# Patient Record
Sex: Female | Born: 1965
Health system: Southern US, Community
[De-identification: ages and names within clinical notes are randomized; demographics above are authoritative.]

## PROBLEM LIST (undated history)

## (undated) DIAGNOSIS — J309 Allergic rhinitis, unspecified: Secondary | ICD-10-CM

## (undated) DIAGNOSIS — Z9289 Personal history of other medical treatment: Secondary | ICD-10-CM

## (undated) DIAGNOSIS — N9419 Other specified dyspareunia: Secondary | ICD-10-CM

## (undated) DIAGNOSIS — E28319 Asymptomatic premature menopause: Secondary | ICD-10-CM

## (undated) DIAGNOSIS — F329 Major depressive disorder, single episode, unspecified: Secondary | ICD-10-CM

## (undated) DIAGNOSIS — F32A Depression, unspecified: Secondary | ICD-10-CM

## (undated) DIAGNOSIS — F419 Anxiety disorder, unspecified: Secondary | ICD-10-CM

## (undated) HISTORY — DX: Other specified dyspareunia: N94.19

## (undated) HISTORY — DX: Allergic rhinitis, unspecified: J30.9

## (undated) HISTORY — DX: Depression, unspecified: F32.A

## (undated) HISTORY — DX: Asymptomatic premature menopause: E28.319

## (undated) HISTORY — DX: Major depressive disorder, single episode, unspecified: F32.9

## (undated) HISTORY — DX: Anxiety disorder, unspecified: F41.9

## (undated) HISTORY — DX: Personal history of other medical treatment: Z92.89

---

## 2004-02-07 ENCOUNTER — Ambulatory Visit: Payer: Self-pay | Admitting: Unknown Physician Specialty

## 2005-09-24 ENCOUNTER — Ambulatory Visit: Payer: Self-pay | Admitting: Unknown Physician Specialty

## 2009-06-08 ENCOUNTER — Ambulatory Visit: Payer: Self-pay | Admitting: Unknown Physician Specialty

## 2009-09-30 ENCOUNTER — Ambulatory Visit: Payer: Self-pay | Admitting: Internal Medicine

## 2013-01-07 DIAGNOSIS — Z9289 Personal history of other medical treatment: Secondary | ICD-10-CM

## 2013-01-07 HISTORY — DX: Personal history of other medical treatment: Z92.89

## 2013-02-08 DIAGNOSIS — Z9289 Personal history of other medical treatment: Secondary | ICD-10-CM

## 2013-02-08 HISTORY — DX: Personal history of other medical treatment: Z92.89

## 2013-02-12 ENCOUNTER — Ambulatory Visit: Payer: Self-pay | Admitting: Family Medicine

## 2016-03-10 ENCOUNTER — Other Ambulatory Visit: Payer: Self-pay | Admitting: Obstetrics and Gynecology

## 2016-03-10 DIAGNOSIS — Z1231 Encounter for screening mammogram for malignant neoplasm of breast: Secondary | ICD-10-CM

## 2016-04-08 ENCOUNTER — Encounter: Payer: Self-pay | Admitting: Radiology

## 2016-04-08 ENCOUNTER — Ambulatory Visit
Admission: RE | Admit: 2016-04-08 | Discharge: 2016-04-08 | Disposition: A | Discharge status: Home / Self Care | Payer: BLUE CROSS/BLUE SHIELD | Source: Ambulatory Visit | Attending: Obstetrics and Gynecology | Admitting: Obstetrics and Gynecology

## 2016-04-08 DIAGNOSIS — Z1231 Encounter for screening mammogram for malignant neoplasm of breast: Secondary | ICD-10-CM | POA: Insufficient documentation

## 2016-04-15 ENCOUNTER — Inpatient Hospital Stay
Admission: RE | Admit: 2016-04-15 | Discharge: 2016-04-15 | Disposition: A | Discharge status: Home / Self Care | Payer: Self-pay | Source: Ambulatory Visit | Attending: *Deleted | Admitting: *Deleted

## 2016-04-15 ENCOUNTER — Other Ambulatory Visit: Payer: Self-pay | Admitting: *Deleted

## 2016-04-15 DIAGNOSIS — Z9289 Personal history of other medical treatment: Secondary | ICD-10-CM

## 2017-03-12 ENCOUNTER — Ambulatory Visit: Payer: Self-pay | Admitting: Obstetrics and Gynecology

## 2017-04-20 ENCOUNTER — Other Ambulatory Visit: Payer: Self-pay | Admitting: Internal Medicine

## 2017-04-20 DIAGNOSIS — Z1231 Encounter for screening mammogram for malignant neoplasm of breast: Secondary | ICD-10-CM

## 2017-05-12 ENCOUNTER — Ambulatory Visit
Admission: RE | Admit: 2017-05-12 | Discharge: 2017-05-12 | Disposition: A | Discharge status: Home / Self Care | Payer: BLUE CROSS/BLUE SHIELD | Source: Ambulatory Visit | Attending: Internal Medicine | Admitting: Internal Medicine

## 2017-05-12 DIAGNOSIS — Z1231 Encounter for screening mammogram for malignant neoplasm of breast: Secondary | ICD-10-CM | POA: Diagnosis present

## 2017-05-24 ENCOUNTER — Other Ambulatory Visit: Payer: Self-pay | Admitting: Obstetrics and Gynecology

## 2017-05-25 NOTE — Telephone Encounter (Signed)
Please advise 

## 2017-11-18 ENCOUNTER — Other Ambulatory Visit: Payer: Self-pay | Admitting: Obstetrics and Gynecology

## 2017-11-18 ENCOUNTER — Telehealth: Payer: Self-pay

## 2017-11-18 MED ORDER — ESTRADIOL 10 MCG VA TABS: 10.0000 ug | ORAL_TABLET | VAGINAL | 0 refills | Status: DC

## 2017-11-18 NOTE — Progress Notes (Signed)
Rx RF till annual 

## 2017-11-18 NOTE — Telephone Encounter (Signed)
Rx RF eRxd.  

## 2017-11-18 NOTE — Telephone Encounter (Signed)
Pt has scheduled appt for 9/5 and is requesting a refill on uvafem.  [phone]

## 2017-12-03 ENCOUNTER — Ambulatory Visit (INDEPENDENT_AMBULATORY_CARE_PROVIDER_SITE_OTHER): Payer: BLUE CROSS/BLUE SHIELD | Admitting: Obstetrics and Gynecology

## 2017-12-03 ENCOUNTER — Encounter: Payer: Self-pay | Admitting: Obstetrics and Gynecology

## 2017-12-03 VITALS — BP 100/60 | HR 64 | Ht 62.0 in | Wt 124.0 lb

## 2017-12-03 DIAGNOSIS — E28319 Asymptomatic premature menopause: Secondary | ICD-10-CM | POA: Insufficient documentation

## 2017-12-03 DIAGNOSIS — N952 Postmenopausal atrophic vaginitis: Secondary | ICD-10-CM

## 2017-12-03 DIAGNOSIS — Z01411 Encounter for gynecological examination (general) (routine) with abnormal findings: Secondary | ICD-10-CM

## 2017-12-03 DIAGNOSIS — Z01419 Encounter for gynecological examination (general) (routine) without abnormal findings: Secondary | ICD-10-CM | POA: Diagnosis not present

## 2017-12-03 DIAGNOSIS — Z13820 Encounter for screening for osteoporosis: Secondary | ICD-10-CM

## 2017-12-03 DIAGNOSIS — Z1239 Encounter for other screening for malignant neoplasm of breast: Secondary | ICD-10-CM

## 2017-12-03 DIAGNOSIS — Z1231 Encounter for screening mammogram for malignant neoplasm of breast: Secondary | ICD-10-CM

## 2017-12-03 MED ORDER — ESTRADIOL 10 MCG VA TABS: 10.0000 ug | ORAL_TABLET | VAGINAL | 3 refills | Status: DC

## 2017-12-03 NOTE — Progress Notes (Addendum)
PCP:  Lynnea Ferrier, MD   Chief Complaint  Patient presents with  . Gynecologic Exam     HPI:      Ms. Haley Lewis is a 52 y.o. G1P1001 who LMP was No LMP recorded. Patient is postmenopausal., presents today for her annual examination.  Her menses are absent due to early menopause (age 78). She does not have intermenstrual bleeding. Otilio Carpen has vasomotor sx.  Sex activity: single partner, contraception - post menopausal status. Pt with severe vag dryness/dyspareunia. Treats with vagifem with minimal relief.  Last Pap: March 12, 2016  Results were: no abnormalities /neg HPV DNA  Hx of STDs: none  Last mammogram: May 12, 2017  Results were: normal--routine follow-up in 12 months There is a FH of breast cancer in her mat aunt, genetic testing not indicated. There is no FH of ovarian cancer. The patient does do self-breast exams.  Tobacco use: The patient denies current or previous tobacco use. Alcohol use: social drinker No drug use.  Exercise: moderately active  She does get adequate calcium but not Vitamin D in her diet. Labs with PCP. Never had DEXA.   Past Medical History:  Diagnosis Date  . Allergic rhinitis   . Anxiety and depression   . Dyspareunia due to medical condition in female   . Early menopause age 30  . History of mammogram 02/08/2013   Birad 1  . History of Papanicolaou smear of cervix 01/07/2013   Neg/neg  . Osteopenia 08/2018   in spine/hip. Repeat in 2 yrs.     Past Surgical History:  Procedure Laterality Date  . CESAREAN SECTION  2003   VanDalen-ruptured membranes, labor, breech    Family History  Problem Relation Age of Onset  . Breast cancer Maternal Aunt 52  . Osteoporosis Maternal Aunt   . Osteoarthritis Mother   . Heart disease Father   . Ovarian cancer Neg Hx     Social History   Socioeconomic History  . Marital status: Married    Spouse name: Not on file  . Number of children: Not on file  . Years of  education: Not on file  . Highest education level: Not on file  Occupational History  . Not on file  Social Needs  . Financial resource strain: Not on file  . Food insecurity    Worry: Not on file    Inability: Not on file  . Transportation needs    Medical: Not on file    Non-medical: Not on file  Tobacco Use  . Smoking status: Never Smoker  . Smokeless tobacco: Never Used  Substance and Sexual Activity  . Alcohol use: Never    Frequency: Never  . Drug use: Never  . Sexual activity: Yes    Birth control/protection: Post-menopausal  Lifestyle  . Physical activity    Days per week: Not on file    Minutes per session: Not on file  . Stress: Not on file  Relationships  . Social Musician on phone: Not on file    Gets together: Not on file    Attends religious service: Not on file    Active member of club or organization: Not on file    Attends meetings of clubs or organizations: Not on file    Relationship status: Not on file  . Intimate partner violence    Fear of current or ex partner: Not on file    Emotionally abused: Not on file  Physically abused: Not on file    Forced sexual activity: Not on file  Other Topics Concern  . Not on file  Social History Narrative  . Not on file    Outpatient Medications Prior to Visit  Medication Sig Dispense Refill  . azelastine (ASTELIN) 0.1 % nasal spray Place into the nose.    . Estradiol (YUVAFEM) 10 MCG TABS vaginal tablet Place 1 tablet (10 mcg total) vaginally 2 (two) times a week. 8 tablet 0   No facility-administered medications prior to visit.       ROS:  Review of Systems  Constitutional: Negative for fatigue, fever and unexpected weight change.  Respiratory: Negative for cough, shortness of breath and wheezing.   Cardiovascular: Negative for chest pain, palpitations and leg swelling.  Gastrointestinal: Negative for blood in stool, constipation, diarrhea, nausea and vomiting.  Endocrine: Negative  for cold intolerance, heat intolerance and polyuria.  Genitourinary: Positive for dyspareunia. Negative for dysuria, flank pain, frequency, genital sores, hematuria, menstrual problem, pelvic pain, urgency, vaginal bleeding, vaginal discharge and vaginal pain.  Musculoskeletal: Negative for back pain, joint swelling and myalgias.  Skin: Negative for rash.  Neurological: Negative for dizziness, syncope, light-headedness, numbness and headaches.  Hematological: Negative for adenopathy.  Psychiatric/Behavioral: Negative for agitation, confusion, sleep disturbance and suicidal ideas. The patient is not nervous/anxious.   BREAST: No symptoms   Objective: BP 100/60   Pulse 64   Ht 5\' 2"  (1.575 m)   Wt 124 lb (56.2 kg)   BMI 22.68 kg/m    Physical Exam  Constitutional: She is oriented to person, place, and time. She appears well-developed and well-nourished.  Genitourinary: Vagina normal and uterus normal. There is no rash or tenderness on the right labia. There is no rash or tenderness on the left labia. No erythema or tenderness in the vagina. No vaginal discharge found. Right adnexum does not display mass and does not display tenderness. Left adnexum does not display mass and does not display tenderness. Cervix does not exhibit motion tenderness or polyp. Uterus is not enlarged or tender.  Genitourinary Comments: SMALL INTROITUS; VAG TISSUE WITH MINIMAL ATROPHY; EXAM IS UNCOMFORTABLE FOR PT  Neck: Normal range of motion. No thyromegaly present.  Cardiovascular: Normal rate, regular rhythm and normal heart sounds.  No murmur heard. Pulmonary/Chest: Effort normal and breath sounds normal. Right breast exhibits no mass, no nipple discharge, no skin change and no tenderness. Left breast exhibits no mass, no nipple discharge, no skin change and no tenderness.  Abdominal: Soft. There is no tenderness. There is no guarding.  Musculoskeletal: Normal range of motion.  Neurological: She is alert and  oriented to person, place, and time. No cranial nerve deficit.  Psychiatric: She has a normal mood and affect. Her behavior is normal.  Vitals reviewed.   Assessment/Plan: Encounter for annual routine gynecological examination  Screening for breast cancer - Pt current on mammo. - Plan: MM 3D SCREEN BREAST BILATERAL, CANCELED: MM DIGITAL SCREENING BILATERAL  Postmenopausal atrophic vaginitis - Rx RF vagifem 2x weekly. Try adding DHEA 5 mg daily to see if helps wiht vag sx.  - Plan: Estradiol (YUVAFEM) 10 MCG TABS vaginal tablet  Early menopause occurring in patient age younger than 45 years - CHeck DEXA with mammo 2/20. Cont ca/add Vit D3.  - Plan: DG Bone Density  Screening for osteoporosis - Plan: DG Bone Density  Meds ordered this encounter  Medications  . Estradiol (YUVAFEM) 10 MCG TABS vaginal tablet    Sig: Place 1  tablet (10 mcg total) vaginally 2 (two) times a week.    Dispense:  25 tablet    Refill:  3    Order Specific Question:   Supervising Provider    Answer:   Nadara Mustard [[id number]]             GYN counsel breast self exam, mammography screening, menopause, osteoporosis, adequate intake of calcium and vitamin D, diet and exercise     F/U  Return in about 1 year (around 12/04/2018).  Haley Lewis B. Tacie Mccuistion, PA-C 12/09/2018 11:51 AM

## 2017-12-03 NOTE — Patient Instructions (Signed)
I value your feedback and entrusting us with your care. If you get a [facility] patient survey, I would appreciate you taking the time to let us know about your experience today. Thank you! 

## 2017-12-07 ENCOUNTER — Telehealth: Payer: Self-pay | Admitting: Obstetrics and Gynecology

## 2017-12-07 NOTE — Telephone Encounter (Signed)
Patient is aware of mammogram and DEXA on Thursday, 05/13/18 @ 10:40am at [facility]. Patient is aware to avoid perfume, lotion, and deoderant for the mammogram, and to avoid wearing metal on her clothing for the DEXA. Directions given.

## 2017-12-07 NOTE — Addendum Note (Signed)
Addended by: Althea Grimmer B on: 12/07/2017 08:31 AM   Modules accepted: Orders

## 2017-12-07 NOTE — Telephone Encounter (Signed)
-----   Message from Rica Records, PA-C sent at 12/03/2017 10:01 AM EDT ----- Pt wants DEXA with mammo in 05/2018. Not sure how to schedule that now vs wait till then.

## 2018-03-15 ENCOUNTER — Telehealth: Payer: Self-pay

## 2018-03-15 NOTE — Telephone Encounter (Signed)
Huntley DecSara can you please call pt and schedule appt please. Thank you.

## 2018-03-15 NOTE — Telephone Encounter (Signed)
Please advise 

## 2018-03-15 NOTE — Telephone Encounter (Signed)
Patient is schedule 03/16/18 with [facility]

## 2018-03-15 NOTE — Telephone Encounter (Signed)
Pt needs HRT appt. Not as easy as a "patch".

## 2018-03-15 NOTE — Telephone Encounter (Signed)
Pt would like rx for the patch for night sweats and irritability that has returned.  The patch seems to work well for her peers.  [phone]

## 2018-03-16 ENCOUNTER — Ambulatory Visit (INDEPENDENT_AMBULATORY_CARE_PROVIDER_SITE_OTHER): Payer: BLUE CROSS/BLUE SHIELD | Admitting: Obstetrics and Gynecology

## 2018-03-16 ENCOUNTER — Encounter: Payer: Self-pay | Admitting: Obstetrics and Gynecology

## 2018-03-16 VITALS — BP 100/70 | HR 74 | Ht 62.0 in | Wt 123.0 lb

## 2018-03-16 DIAGNOSIS — R454 Irritability and anger: Secondary | ICD-10-CM | POA: Diagnosis not present

## 2018-03-16 DIAGNOSIS — N951 Menopausal and female climacteric states: Secondary | ICD-10-CM | POA: Diagnosis not present

## 2018-03-16 MED ORDER — PROGESTERONE MICRONIZED 100 MG PO CAPS: ORAL_CAPSULE | ORAL | 1 refills | Status: DC

## 2018-03-16 NOTE — Progress Notes (Signed)
System, Provider Not In   Chief Complaint  Patient presents with  . Hormone Replacement Therapy    pt has started hot flashes again about 4 weeks ago    HPI:      Haley Lewis is a 52 y.o. G1P1001 who LMP was No LMP recorded. Patient is postmenopausal., presents today for vasomotor sx (hot flashes and night sweats) for the past 4 wks. Pt also with mood changes/irritability. Went through early menopause age 52. Hasn't been on HRT in past except vag ERT for vag dryness. Has been under increased stress recently which may contribute to vasomotor sx. Had normal TSH with PCP summer 2019. Pt would like to try medication for mood. Has done antidepressants in distant past for irritability/anger with sx improvement, but would rather try something else for now.  Last annual 9/19.    Past Medical History:  Diagnosis Date  . Allergic rhinitis   . Anxiety and depression   . Dyspareunia due to medical condition in female   . Early menopause age 52  . History of mammogram 02/08/2013   Birad 1  . History of Papanicolaou smear of cervix 01/07/2013   Neg/neg    Past Surgical History:  Procedure Laterality Date  . CESAREAN SECTION  2003   VanDalen-ruptured membranes, labor, breech    Family History  Problem Relation Age of Onset  . Breast cancer Maternal Aunt 52  . Osteoporosis Maternal Aunt   . Osteoarthritis Mother   . Heart disease Father   . Ovarian cancer Neg Hx     Social History   Socioeconomic History  . Marital status: Married    Spouse name: Not on file  . Number of children: Not on file  . Years of education: Not on file  . Highest education level: Not on file  Occupational History  . Not on file  Social Needs  . Financial resource strain: Not on file  . Food insecurity:    Worry: Not on file    Inability: Not on file  . Transportation needs:    Medical: Not on file    Non-medical: Not on file  Tobacco Use  . Smoking status: Never Smoker  . Smokeless  tobacco: Never Used  Substance and Sexual Activity  . Alcohol use: Never    Frequency: Never  . Drug use: Never  . Sexual activity: Yes    Birth control/protection: Post-menopausal  Lifestyle  . Physical activity:    Days per week: Not on file    Minutes per session: Not on file  . Stress: Not on file  Relationships  . Social connections:    Talks on phone: Not on file    Gets together: Not on file    Attends religious service: Not on file    Active member of club or organization: Not on file    Attends meetings of clubs or organizations: Not on file    Relationship status: Not on file  . Intimate partner violence:    Fear of current or ex partner: Not on file    Emotionally abused: Not on file    Physically abused: Not on file    Forced sexual activity: Not on file  Other Topics Concern  . Not on file  Social History Narrative  . Not on file    Outpatient Medications Prior to Visit  Medication Sig Dispense Refill  . azelastine (ASTELIN) 0.1 % nasal spray Place into the nose.    .Marland Kitchen  Estradiol (YUVAFEM) 10 MCG TABS vaginal tablet Place 1 tablet (10 mcg total) vaginally 2 (two) times a week. (Patient not taking: Reported on 03/16/2018) 25 tablet 3   No facility-administered medications prior to visit.     ROS:  Review of Systems  Constitutional: Negative for fever.  Gastrointestinal: Negative for blood in stool, constipation, diarrhea, nausea and vomiting.  Genitourinary: Negative for dyspareunia, dysuria, flank pain, frequency, hematuria, urgency, vaginal bleeding, vaginal discharge and vaginal pain.  Musculoskeletal: Negative for back pain.  Skin: Negative for rash.   BREAST: No symptoms   OBJECTIVE:   Vitals:  BP 100/70   Pulse 74   Ht 5\' 2"  (1.575 m)   Wt 123 lb (55.8 kg)   BMI 22.50 kg/m   Physical Exam Vitals signs reviewed.  Constitutional:      Appearance: She is well-developed.  Neck:     Musculoskeletal: Normal range of motion.  Pulmonary:      Effort: Pulmonary effort is normal.  Musculoskeletal: Normal range of motion.  Neurological:     Mental Status: She is alert and oriented to person, place, and time.     Cranial Nerves: No cranial nerve deficit.  Psychiatric:        Behavior: Behavior normal.        Thought Content: Thought content normal.        Judgment: Judgment normal.      Assessment/Plan: Vasomotor symptoms due to menopause - Plan: progesterone (PROMETRIUM) 100 MG capsule  Irritability - May be better treated with SSRI but will try HRT first.  Discussed tx options. Given length of time since LMP, try prog first. May need to add ERT. May be related to stress given acute onset. Rx prometrium. F/u via phone in 7-8 wks re: sx and med mgmt/sooner prn.   Meds ordered this encounter  Medications  . progesterone (PROMETRIUM) 100 MG capsule    Sig: Take 1 cap nightly, 6 nights on, 1 night off    Dispense:  30 capsule    Refill:  1    Order Specific Question:   Supervising Provider    Answer:   Nadara Mustard [[id number]]      Return if symptoms worsen or fail to improve.  Alicia B. Copland, PA-C 03/16/2018 3:56 PM

## 2018-03-16 NOTE — Patient Instructions (Signed)
I value your feedback and entrusting us with your care. If you get a [facility] patient survey, I would appreciate you taking the time to let us know about your experience today. Thank you! 

## 2018-05-13 ENCOUNTER — Other Ambulatory Visit: Payer: BLUE CROSS/BLUE SHIELD

## 2018-06-16 ENCOUNTER — Other Ambulatory Visit: Payer: Self-pay | Admitting: Obstetrics and Gynecology

## 2018-06-16 DIAGNOSIS — N951 Menopausal and female climacteric states: Secondary | ICD-10-CM

## 2018-06-16 NOTE — Telephone Encounter (Signed)
Please advise 

## 2018-06-17 ENCOUNTER — Encounter: Payer: Self-pay | Admitting: Obstetrics and Gynecology

## 2018-06-18 ENCOUNTER — Other Ambulatory Visit: Payer: Self-pay | Admitting: Obstetrics and Gynecology

## 2018-06-18 DIAGNOSIS — N951 Menopausal and female climacteric states: Secondary | ICD-10-CM

## 2018-06-18 MED ORDER — PROGESTERONE MICRONIZED 100 MG PO CAPS: ORAL_CAPSULE | ORAL | 1 refills | Status: DC

## 2018-06-18 NOTE — Progress Notes (Signed)
Rx RF progesterone for menopausal sx . Good sx control.

## 2018-06-22 ENCOUNTER — Other Ambulatory Visit: Payer: BLUE CROSS/BLUE SHIELD

## 2018-07-29 ENCOUNTER — Other Ambulatory Visit: Payer: BLUE CROSS/BLUE SHIELD

## 2018-08-30 DIAGNOSIS — M8580 Other specified disorders of bone density and structure, unspecified site: Secondary | ICD-10-CM

## 2018-08-30 HISTORY — DX: Other specified disorders of bone density and structure, unspecified site: M85.80

## 2018-09-17 ENCOUNTER — Ambulatory Visit
Admission: RE | Admit: 2018-09-17 | Discharge: 2018-09-17 | Disposition: A | Discharge status: Home / Self Care | Payer: BLUE CROSS/BLUE SHIELD | Source: Ambulatory Visit | Attending: Obstetrics and Gynecology | Admitting: Obstetrics and Gynecology

## 2018-09-17 ENCOUNTER — Encounter: Payer: Self-pay | Admitting: Obstetrics and Gynecology

## 2018-09-17 ENCOUNTER — Other Ambulatory Visit: Payer: Self-pay

## 2018-09-17 DIAGNOSIS — M85831 Other specified disorders of bone density and structure, right forearm: Secondary | ICD-10-CM | POA: Diagnosis not present

## 2018-09-17 DIAGNOSIS — Z13820 Encounter for screening for osteoporosis: Secondary | ICD-10-CM | POA: Insufficient documentation

## 2018-09-17 DIAGNOSIS — Z1231 Encounter for screening mammogram for malignant neoplasm of breast: Secondary | ICD-10-CM | POA: Diagnosis not present

## 2018-09-17 DIAGNOSIS — E28319 Asymptomatic premature menopause: Secondary | ICD-10-CM

## 2018-09-17 DIAGNOSIS — Z1239 Encounter for other screening for malignant neoplasm of breast: Secondary | ICD-10-CM

## 2018-09-17 DIAGNOSIS — M85851 Other specified disorders of bone density and structure, right thigh: Secondary | ICD-10-CM | POA: Insufficient documentation

## 2018-09-20 ENCOUNTER — Encounter: Payer: Self-pay | Admitting: Obstetrics and Gynecology

## 2018-11-24 ENCOUNTER — Encounter: Payer: Self-pay | Admitting: Obstetrics and Gynecology

## 2018-11-24 NOTE — Telephone Encounter (Signed)
Whom can I contact in billing to discuss this with? Thx

## 2018-12-17 ENCOUNTER — Encounter: Payer: Self-pay | Admitting: Obstetrics and Gynecology

## 2018-12-17 ENCOUNTER — Ambulatory Visit: Payer: BLUE CROSS/BLUE SHIELD | Admitting: Obstetrics and Gynecology

## 2018-12-19 ENCOUNTER — Encounter: Payer: Self-pay | Admitting: Obstetrics and Gynecology

## 2019-01-10 ENCOUNTER — Encounter: Payer: Self-pay | Admitting: Obstetrics and Gynecology

## 2019-01-11 ENCOUNTER — Ambulatory Visit (INDEPENDENT_AMBULATORY_CARE_PROVIDER_SITE_OTHER): Payer: BLUE CROSS/BLUE SHIELD | Admitting: Obstetrics and Gynecology

## 2019-01-11 ENCOUNTER — Other Ambulatory Visit: Payer: Self-pay

## 2019-01-11 ENCOUNTER — Encounter: Payer: Self-pay | Admitting: Obstetrics and Gynecology

## 2019-01-11 VITALS — BP 110/70 | Ht 62.0 in | Wt 125.0 lb

## 2019-01-11 DIAGNOSIS — Z01419 Encounter for gynecological examination (general) (routine) without abnormal findings: Secondary | ICD-10-CM | POA: Diagnosis not present

## 2019-01-11 DIAGNOSIS — N951 Menopausal and female climacteric states: Secondary | ICD-10-CM

## 2019-01-11 DIAGNOSIS — Z1211 Encounter for screening for malignant neoplasm of colon: Secondary | ICD-10-CM

## 2019-01-11 DIAGNOSIS — Z1231 Encounter for screening mammogram for malignant neoplasm of breast: Secondary | ICD-10-CM

## 2019-01-11 DIAGNOSIS — N952 Postmenopausal atrophic vaginitis: Secondary | ICD-10-CM

## 2019-01-11 MED ORDER — PROGESTERONE MICRONIZED 100 MG PO CAPS: ORAL_CAPSULE | ORAL | 3 refills | Status: AC

## 2019-01-11 NOTE — Patient Instructions (Signed)
I value your feedback and entrusting us with your care. If you get a [facility] patient survey, I would appreciate you taking the time to let us know about your experience today. Thank you! 

## 2019-01-11 NOTE — Progress Notes (Signed)
PCP:  Adin Hector, MD   Chief Complaint  Patient presents with   Gynecologic Exam     HPI:      Ms. Haley Lewis is a 53 y.o. G1P1001 who LMP was No LMP recorded. Patient is postmenopausal., presents today for her annual examination.  Her menses are absent due to early menopause (age 30). She does not have intermenstrual bleeding. Was having bad vasomotor sx with mood changes 12/19; started prometrium with improved sx. Life stressors also improved so pt not sure if progesterone or not. Wants to continue for now.   Sex activity: single partner, contraception - post menopausal status. Pt with severe vag dryness/dyspareunia. Treated with vagifem with minimal relief in past, not using any vag ERT currently. Not sure if she wants to cont.  Last Pap: March 12, 2016  Results were: no abnormalities /neg HPV DNA  Hx of STDs: none  Last mammogram: 09/17/18  Results were: normal--routine follow-up in 12 months There is a FH of breast cancer in her mat aunt, genetic testing not indicated. There is no FH of ovarian cancer. The patient does do self-breast exams.  Tobacco use: The patient denies current or previous tobacco use. Alcohol use: social drinker No drug use.  Exercise: moderately active  She does get adequate calcium and Vitamin D in her diet. Labs with PCP. DEXA: 6/20 with osteopenia in spine/hip  Colonoscopy: due this yr, wants GI ref  Past Medical History:  Diagnosis Date   Allergic rhinitis    Anxiety and depression    Dyspareunia due to medical condition in female    Early menopause age 61   History of mammogram 02/08/2013   Birad 1   History of Papanicolaou smear of cervix 01/07/2013   Neg/neg   Osteopenia 08/2018   in spine/hip. Repeat in 2 yrs.     Past Surgical History:  Procedure Laterality Date   CESAREAN SECTION  2003   VanDalen-ruptured membranes, labor, breech    Family History  Problem Relation Age of Onset   Breast cancer  Maternal Aunt 39   Osteoporosis Maternal Aunt    Osteoarthritis Mother    Heart disease Father    Ovarian cancer Neg Hx     Social History   Socioeconomic History   Marital status: Married    Spouse name: Not on file   Number of children: Not on file   Years of education: Not on file   Highest education level: Not on file  Occupational History   Not on file  Social Needs   Financial resource strain: Not on file   Food insecurity    Worry: Not on file    Inability: Not on file   Transportation needs    Medical: Not on file    Non-medical: Not on file  Tobacco Use   Smoking status: Never Smoker   Smokeless tobacco: Never Used  Substance and Sexual Activity   Alcohol use: Never    Frequency: Never   Drug use: Never   Sexual activity: Yes    Birth control/protection: Post-menopausal  Lifestyle   Physical activity    Days per week: Not on file    Minutes per session: Not on file   Stress: Not on file  Relationships   Social connections    Talks on phone: Not on file    Gets together: Not on file    Attends religious service: Not on file    Active member of club or  organization: Not on file    Attends meetings of clubs or organizations: Not on file    Relationship status: Not on file   Intimate partner violence    Fear of current or ex partner: Not on file    Emotionally abused: Not on file    Physically abused: Not on file    Forced sexual activity: Not on file  Other Topics Concern   Not on file  Social History Narrative   Not on file    Outpatient Medications Prior to Visit  Medication Sig Dispense Refill   azelastine (ASTELIN) 0.1 % nasal spray Place into the nose.     progesterone (PROMETRIUM) 100 MG capsule Take 1 cap nightly, 6 nights on, 1 night off 90 capsule 1   Estradiol (YUVAFEM) 10 MCG TABS vaginal tablet Place 1 tablet (10 mcg total) vaginally 2 (two) times a week. (Patient not taking: Reported on 03/16/2018) 25 tablet 3     No facility-administered medications prior to visit.       ROS:  Review of Systems  Constitutional: Negative for fatigue, fever and unexpected weight change.  Respiratory: Negative for cough, shortness of breath and wheezing.   Cardiovascular: Negative for chest pain, palpitations and leg swelling.  Gastrointestinal: Negative for blood in stool, constipation, diarrhea, nausea and vomiting.  Endocrine: Negative for cold intolerance, heat intolerance and polyuria.  Genitourinary: Positive for dyspareunia. Negative for dysuria, flank pain, frequency, genital sores, hematuria, menstrual problem, pelvic pain, urgency, vaginal bleeding, vaginal discharge and vaginal pain.  Musculoskeletal: Negative for back pain, joint swelling and myalgias.  Skin: Negative for rash.  Neurological: Negative for dizziness, syncope, light-headedness, numbness and headaches.  Hematological: Negative for adenopathy.  Psychiatric/Behavioral: Negative for agitation, confusion, sleep disturbance and suicidal ideas. The patient is not nervous/anxious.   BREAST: No symptoms   Objective: BP 110/70    Ht 5\' 2"  (1.575 m)    Wt 125 lb (56.7 kg)    BMI 22.86 kg/m    Physical Exam Constitutional:      Appearance: She is well-developed.  Genitourinary:     Vulva, vagina, uterus, right adnexa and left adnexa normal.     No vulval lesion or tenderness noted.     No vaginal discharge, erythema or tenderness.     No cervical motion tenderness or polyp.     Uterus is not enlarged or tender.     No right or left adnexal mass present.     Right adnexa not tender.     Left adnexa not tender.     Genitourinary Comments: SMALL INTROITUS; VAG TISSUE WITH MINIMAL TO MOD ATROPHY; EXAM IS UNCOMFORTABLE FOR PT  Neck:     Musculoskeletal: Normal range of motion.     Thyroid: No thyromegaly.  Cardiovascular:     Rate and Rhythm: Normal rate and regular rhythm.     Heart sounds: Normal heart sounds. No murmur.  Pulmonary:      Effort: Pulmonary effort is normal.     Breath sounds: Normal breath sounds.  Chest:     Breasts:        Right: No mass, nipple discharge, skin change or tenderness.        Left: No mass, nipple discharge, skin change or tenderness.  Abdominal:     Palpations: Abdomen is soft.     Tenderness: There is no abdominal tenderness. There is no guarding.  Musculoskeletal: Normal range of motion.  Neurological:     General: No focal deficit present.  Mental Status: She is alert and oriented to person, place, and time.     Cranial Nerves: No cranial nerve deficit.  Skin:    General: Skin is warm and dry.  Psychiatric:        Mood and Affect: Mood normal.        Behavior: Behavior normal.        Thought Content: Thought content normal.        Judgment: Judgment normal.  Vitals signs reviewed.     Assessment/Plan: Encounter for annual routine gynecological examination  Encounter for screening mammogram for malignant neoplasm of breast--pt current on mammo  Postmenopausal atrophic vaginitis--pt to f/u if wants vag ERT. F/u prn.  Vasomotor symptoms due to menopause - Plan: progesterone (PROMETRIUM) 100 MG capsule; Rx RF. Improved sx. F/u prn.   Screening for colon cancer - Plan: Ambulatory referral to Gastroenterology; Refer for scr colonoscopy due to age  Meds ordered this encounter  Medications   progesterone (PROMETRIUM) 100 MG capsule    Sig: Take 1 cap nightly, 6 nights on, 1 night off    Dispense:  90 capsule    Refill:  3    Order Specific Question:   Supervising Provider    Answer:   Nadara MustardHARRIS, ROBERT P [[id number]]             GYN counsel breast self exam, mammography screening, menopause, osteoporosis, adequate intake of calcium and vitamin D, diet and exercise     F/U  Return in about 1 year (around 01/11/2020).  Alizzon Dioguardi B. Marlyn Tondreau, PA-C 01/11/2019 3:52 PM

## 2019-01-12 ENCOUNTER — Encounter: Payer: Self-pay | Admitting: Obstetrics and Gynecology

## 2019-02-15 ENCOUNTER — Encounter: Payer: Self-pay | Admitting: Obstetrics and Gynecology

## 2019-02-18 ENCOUNTER — Telehealth: Payer: Self-pay | Admitting: Obstetrics and Gynecology

## 2019-02-18 NOTE — Telephone Encounter (Signed)
Left message for patient to call me back in regards to her appt for her colonoscopy.

## 2019-11-25 IMAGING — MG DIGITAL SCREENING BILATERAL MAMMOGRAM WITH TOMO AND CAD
8 series · 9 of 24 positions shown · non-contrast
Comparison: Previous exam(s).

CLINICAL DATA: Screening.

EXAM:
DIGITAL SCREENING BILATERAL MAMMOGRAM WITH TOMO AND CAD

[L MLO synth-2D]
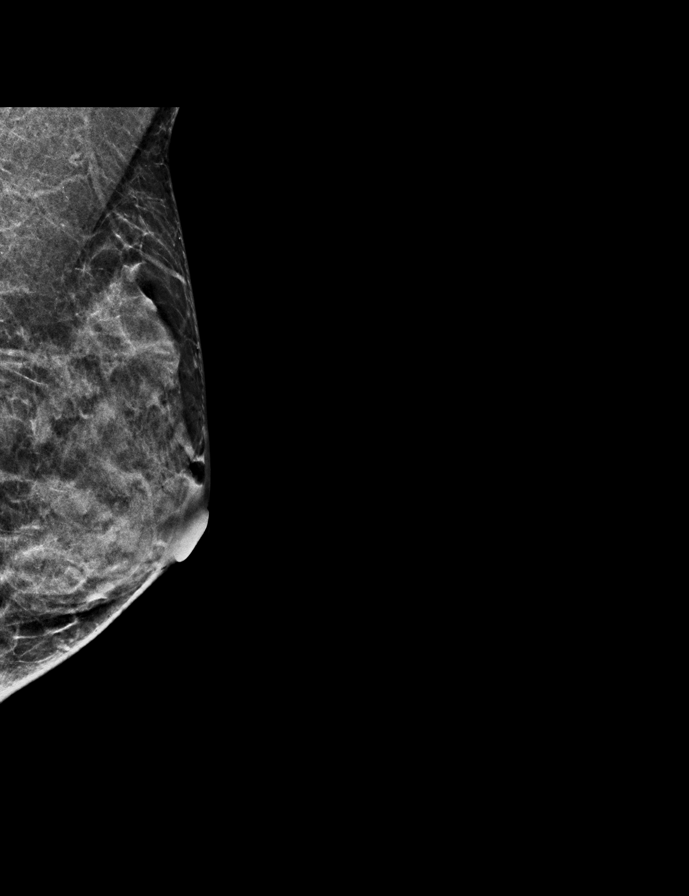

[R CC synth-2D]
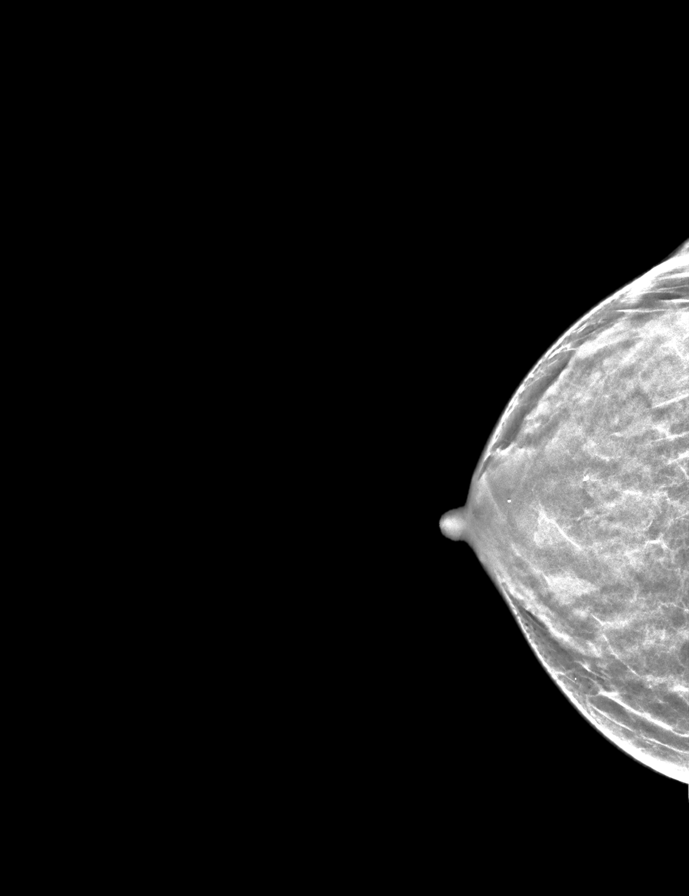

[R MLO synth-2D]
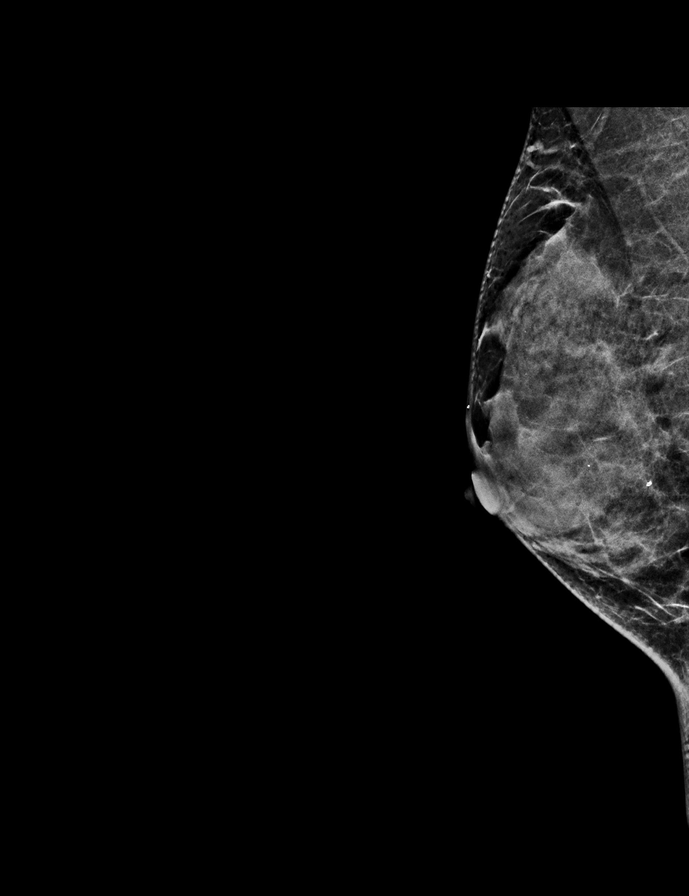

[L CC synth-2D]
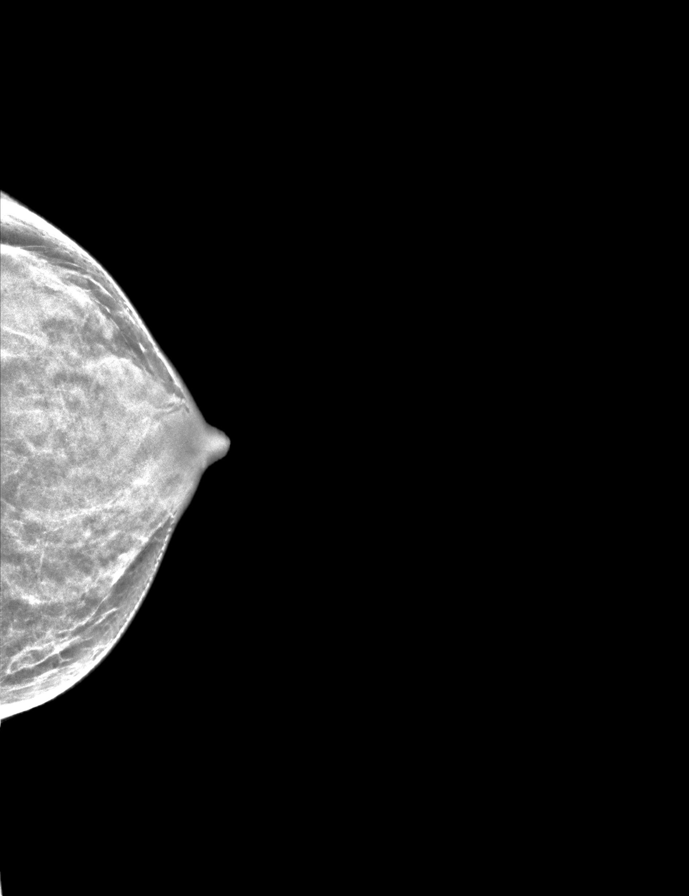

[L CC tomo · 2 of 44 frames shown]
[frame 15/44]
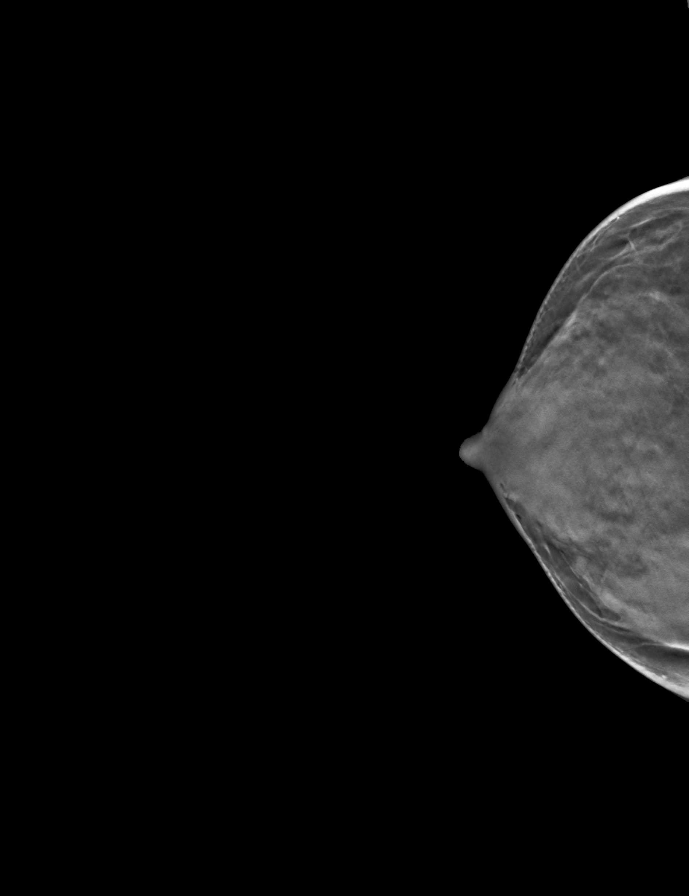
[frame 23/44]
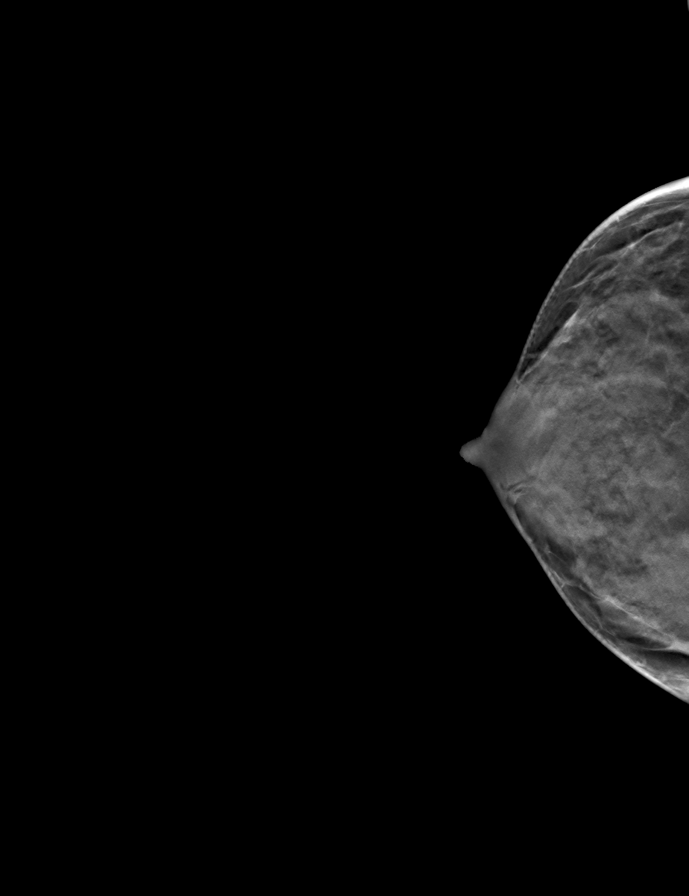

[R CC tomo · tomo slice 23/46.0]
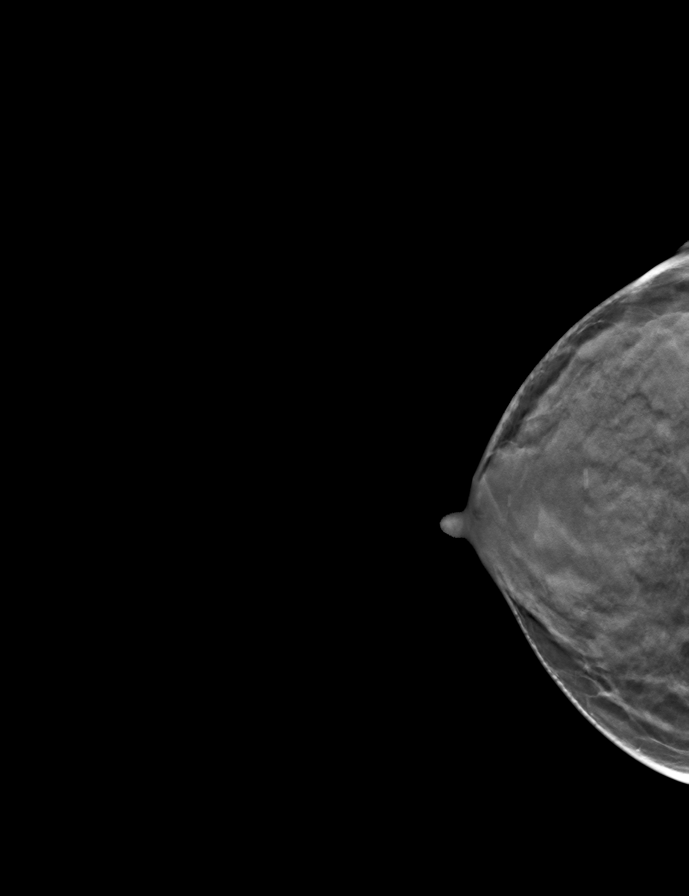

[R MLO tomo · tomo slice 23/46.0]
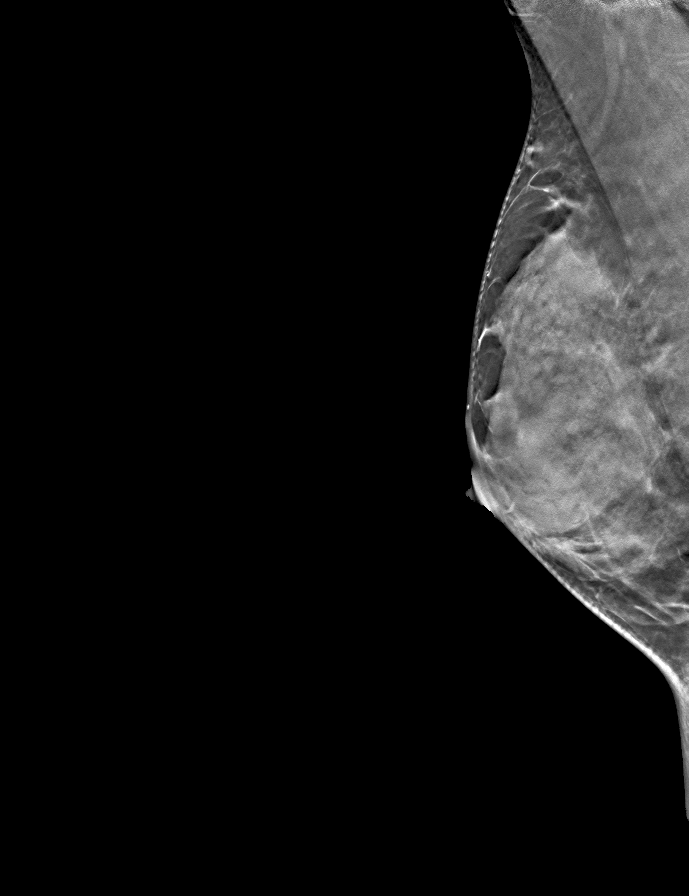

[L MLO tomo · tomo slice 21/42.0]
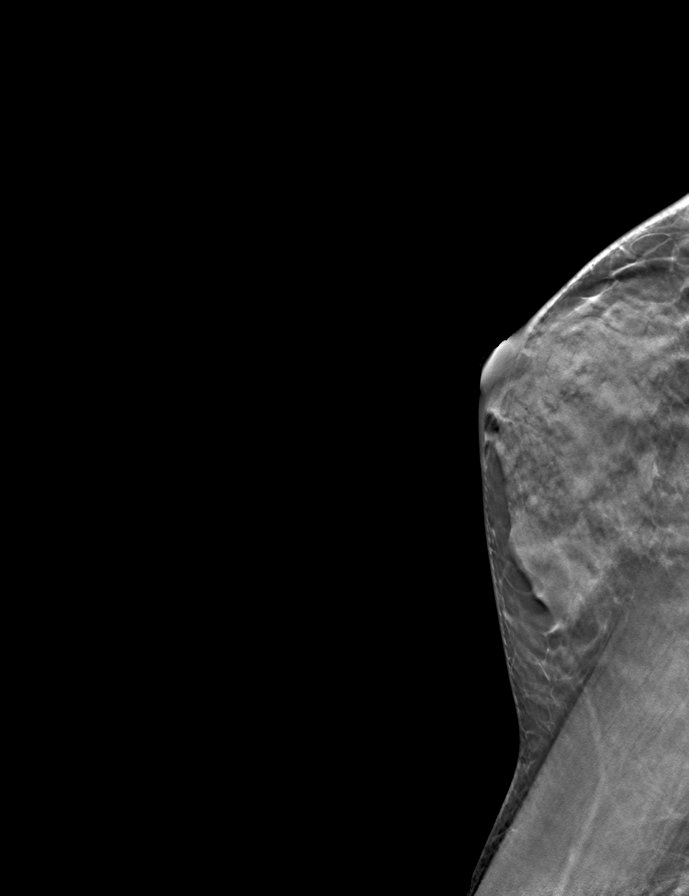

[9 of 24 positions shown; findings below may reference images not displayed]

ACR Breast Density Category d: The breast tissue is extremely dense,
which lowers the sensitivity of mammography
FINDINGS: There are no findings suspicious for malignancy. Images were
processed with CAD.
IMPRESSION: No mammographic evidence of malignancy. A result letter of this
screening mammogram will be mailed directly to the patient.

RECOMMENDATION:
Screening mammogram in one year. (Code:WO-0-ZI0)

BI-RADS CATEGORY  1: Negative.

## 2020-02-06 ENCOUNTER — Other Ambulatory Visit: Payer: Self-pay | Admitting: Obstetrics and Gynecology

## 2020-02-06 DIAGNOSIS — N951 Menopausal and female climacteric states: Secondary | ICD-10-CM

## 2020-02-29 ENCOUNTER — Other Ambulatory Visit: Payer: Self-pay | Admitting: Obstetrics and Gynecology

## 2020-02-29 ENCOUNTER — Telehealth: Payer: Self-pay

## 2020-02-29 DIAGNOSIS — Z79890 Hormone replacement therapy (postmenopausal): Secondary | ICD-10-CM

## 2020-02-29 DIAGNOSIS — E28319 Asymptomatic premature menopause: Secondary | ICD-10-CM

## 2020-02-29 MED ORDER — PROGESTERONE MICRONIZED 100 MG PO CAPS: ORAL_CAPSULE | ORAL | 0 refills | Status: AC

## 2020-02-29 MED ORDER — ESTRADIOL 0.5 MG PO TABS
0.5000 mg | ORAL_TABLET | Freq: Every day | ORAL | 0 refills | Status: DC
Start: 2020-02-29 — End: 2020-06-11

## 2020-02-29 NOTE — Telephone Encounter (Signed)
Rx RF prometrium for 90 days due to insurance issues/[employer] OON. Also discussed adding ERT due to studies encouraging use with early menopause. Pt ok with that. Rx estradiol. F/u prn.

## 2020-05-23 ENCOUNTER — Other Ambulatory Visit: Payer: Self-pay | Admitting: Obstetrics and Gynecology

## 2020-05-23 DIAGNOSIS — Z79890 Hormone replacement therapy (postmenopausal): Secondary | ICD-10-CM

## 2020-05-23 DIAGNOSIS — E28319 Asymptomatic premature menopause: Secondary | ICD-10-CM

## 2020-05-24 ENCOUNTER — Other Ambulatory Visit: Payer: Self-pay | Admitting: Obstetrics and Gynecology

## 2020-05-24 DIAGNOSIS — E28319 Asymptomatic premature menopause: Secondary | ICD-10-CM

## 2020-05-24 DIAGNOSIS — Z79890 Hormone replacement therapy (postmenopausal): Secondary | ICD-10-CM

## 2020-06-11 ENCOUNTER — Other Ambulatory Visit: Payer: Self-pay | Admitting: Obstetrics and Gynecology

## 2020-06-11 ENCOUNTER — Telehealth: Payer: Self-pay

## 2020-06-11 DIAGNOSIS — Z79890 Hormone replacement therapy (postmenopausal): Secondary | ICD-10-CM

## 2020-06-11 DIAGNOSIS — E28319 Asymptomatic premature menopause: Secondary | ICD-10-CM

## 2020-06-11 MED ORDER — ESTRADIOL 0.5 MG PO TABS: 0.5000 mg | ORAL_TABLET | Freq: Every day | ORAL | 0 refills | Status: AC

## 2020-06-11 NOTE — Telephone Encounter (Signed)
Pt aware.

## 2020-06-11 NOTE — Telephone Encounter (Signed)
The only other local GYN that may be in her [employer] network is [facility]. Otherwise, she'll have to go towards [facility] [place]. I sent in estradiol RF.

## 2020-06-11 NOTE — Telephone Encounter (Signed)
Pt calling; would like [facility] to recommend someone to her to be her provider as we do not accept [facility]; also needs refill of estradiol for one more month to give her time to get in c someone.  [phone]

## 2020-06-11 NOTE — Progress Notes (Signed)
Rx RF estradiol until pt gets new Gyn due to insurance

## 2020-06-19 ENCOUNTER — Other Ambulatory Visit: Payer: Self-pay | Admitting: Internal Medicine

## 2020-06-19 DIAGNOSIS — Z1231 Encounter for screening mammogram for malignant neoplasm of breast: Secondary | ICD-10-CM

## 2020-06-26 ENCOUNTER — Ambulatory Visit: Payer: BLUE CROSS/BLUE SHIELD

## 2022-09-01 ENCOUNTER — Other Ambulatory Visit: Payer: Self-pay | Admitting: Certified Nurse Midwife

## 2022-09-01 DIAGNOSIS — Z1231 Encounter for screening mammogram for malignant neoplasm of breast: Secondary | ICD-10-CM

## 2022-11-24 ENCOUNTER — Ambulatory Visit
Admission: RE | Admit: 2022-11-24 | Discharge: 2022-11-24 | Disposition: A | Discharge status: Home / Self Care | Payer: BC Managed Care – PPO | Source: Ambulatory Visit | Attending: Certified Nurse Midwife | Admitting: Certified Nurse Midwife

## 2022-11-24 DIAGNOSIS — Z1231 Encounter for screening mammogram for malignant neoplasm of breast: Secondary | ICD-10-CM | POA: Diagnosis present

## 2022-11-25 ENCOUNTER — Other Ambulatory Visit: Payer: Self-pay | Admitting: *Deleted

## 2022-11-25 ENCOUNTER — Inpatient Hospital Stay
Admission: RE | Admit: 2022-11-25 | Discharge: 2022-11-25 | Disposition: A | Discharge status: Home / Self Care | Payer: Self-pay | Source: Ambulatory Visit | Attending: Certified Nurse Midwife | Admitting: Certified Nurse Midwife

## 2022-11-25 DIAGNOSIS — Z1231 Encounter for screening mammogram for malignant neoplasm of breast: Secondary | ICD-10-CM

## 2023-01-23 ENCOUNTER — Other Ambulatory Visit: Payer: Self-pay | Admitting: Internal Medicine

## 2023-01-23 DIAGNOSIS — R7303 Prediabetes: Secondary | ICD-10-CM

## 2023-01-23 DIAGNOSIS — E7800 Pure hypercholesterolemia, unspecified: Secondary | ICD-10-CM

## 2023-01-23 DIAGNOSIS — Z0000 Encounter for general adult medical examination without abnormal findings: Secondary | ICD-10-CM

## 2023-01-23 DIAGNOSIS — Z8249 Family history of ischemic heart disease and other diseases of the circulatory system: Secondary | ICD-10-CM

## 2023-01-27 ENCOUNTER — Ambulatory Visit
Admission: RE | Admit: 2023-01-27 | Discharge: 2023-01-27 | Disposition: A | Discharge status: Home / Self Care | Payer: BC Managed Care – PPO | Source: Ambulatory Visit | Attending: Internal Medicine | Admitting: Internal Medicine

## 2023-01-27 DIAGNOSIS — Z8249 Family history of ischemic heart disease and other diseases of the circulatory system: Secondary | ICD-10-CM | POA: Insufficient documentation

## 2023-01-27 DIAGNOSIS — E7800 Pure hypercholesterolemia, unspecified: Secondary | ICD-10-CM | POA: Insufficient documentation

## 2023-01-27 DIAGNOSIS — R7303 Prediabetes: Secondary | ICD-10-CM | POA: Insufficient documentation

## 2023-01-27 DIAGNOSIS — Z0000 Encounter for general adult medical examination without abnormal findings: Secondary | ICD-10-CM | POA: Insufficient documentation

## 2024-04-18 ENCOUNTER — Other Ambulatory Visit: Payer: Self-pay | Admitting: Internal Medicine

## 2024-04-18 DIAGNOSIS — Z1231 Encounter for screening mammogram for malignant neoplasm of breast: Secondary | ICD-10-CM

## 2024-05-06 ENCOUNTER — Ambulatory Visit

## 2024-05-06 ENCOUNTER — Other Ambulatory Visit: Payer: Self-pay

## 2024-05-06 DIAGNOSIS — R279 Unspecified lack of coordination: Secondary | ICD-10-CM

## 2024-05-06 DIAGNOSIS — M6281 Muscle weakness (generalized): Secondary | ICD-10-CM

## 2024-05-06 DIAGNOSIS — R102 Pelvic and perineal pain unspecified side: Secondary | ICD-10-CM

## 2024-05-06 NOTE — Therapy (Signed)
 " OUTPATIENT PHYSICAL THERAPY FEMALE PELVIC EVALUATION   Patient Name: Haley Lewis MRN: [id number] DOB:01-16-1966, 59 y.o., female Today's Date: 05/06/2024  END OF SESSION:  PT End of Session - 05/06/24 0845     Visit Number 1    Number of Visits 13    Date for Recertification  07/29/24    PT Start Time 0845    Activity Tolerance Patient tolerated treatment well    Behavior During Therapy [facility] for tasks assessed/performed          Past Medical History:  Diagnosis Date   Allergic rhinitis    Anxiety and depression    Dyspareunia due to medical condition in female    Early menopause age 52   History of mammogram 02/08/2013   Birad 1   History of Papanicolaou smear of cervix 01/07/2013   Neg/neg   Osteopenia 08/2018   in spine/hip. Repeat in 2 yrs.    Past Surgical History:  Procedure Laterality Date   CESAREAN SECTION  2003   [place]-ruptured membranes, labor, breech   Patient Active Problem List   Diagnosis Date Noted   Irritability 03/16/2018   Vasomotor symptoms due to menopause 03/16/2018   Early menopause occurring in patient age younger than 69 years 12/03/2017    PCP: Fernande Ophelia JINNY DOUGLAS, MD  REFERRING PROVIDER: Verdon Keen, MD  REFERRING DIAG: N94.10 (ICD-10-CM) - Dyspareunia in female  THERAPY DIAG:  No diagnosis found.  Rationale for Evaluation and Treatment: Rehabilitation  ONSET DATE: 14 years ago around age 83  SUBJECTIVE:                                                                                                                                                                                           SUBJECTIVE STATEMENT: This is embarrassing.   FUNCTIONAL LIMITATIONS: vaginal pain with intercourse, peri-care and papsmears.  PERTINENT HISTORY:   Pt is a 59 yo female who presents with vaginal pain with intercourse, peri-care and papsmears. She states she has stabbing pain with penetration and pain with external stimulation.  She has deep vaginal pain with penetration and also pain . She states her pain has gradually increased over time. She tried estradiol  approximately 10 years ago with no benefit. She has taken on adopted twins and foster children. She feels like there is no hope. She is unable to have intercourse and this causes distress in her relationship. Pt reports she is active and healthy but doesn't  workout much. She reports no discomfort with anything besides intercourse. She reports she has sensitivity in her vulva and perineum. She has pain with physical stimulation to her  vulva the more firm the touch the more discomfort she has. She has some discomfort with wiping. She has not trialed vaginal moisturizers. She has stabbing pain with any insertion, external stimulation also causes stabbing pain.  She also has pain with papsmear.  S/s with vulvodynia. She has had trouble with conception and difficulty enjoying sex. Taking Celexa as an anti anxiety. She is currently seeing a psychiatrist, she was seeing a counselor but has not seen them since November due to limitations in time and schedule. No current relaxation or down regulation exercises. She has open time in the morning and at night.   Pt had fertility difficulties. She had a C-section with her first child at age 66. She then went through an adoption process with some challenges and heart ache along the way. She adopted twins at age 33 with a lot of medical needs which was very stressful. In 2021 they began fostering 2 more children who are are now 6 and 10.   Vaginal Pain (New Pt ref JO - vaginal pain/irritation with intercourse, has tried estrace  cream & estrace  tabs vaginally in the past with minimal relief)  Problem List:  Pain with external and internal vaginal stimulation She is unable to have intercourse and this causes distress in her relationship. 10/10 pain with penetration Pt has vaginal dryness and discomfort. She has not trialed vaginal  moisturizers.  No current relaxation or down regulation exercises. She has open time in the morning and at night.   Medications for current condition: None Surgeries: C-section 2003 Sexual abuse: No  PAIN:  Are you having pain? Yes NPRS scale: Current 0/10; Best 0/10; Worst 10/10-intercourse Pain location: Internal, External, and vulva and clitoris   Pain type: burning and sharp Pain description: stabbing   Aggravating factors: Intercourse or touch to vulva or deep penetration Relieving factors: Not touching     URINATION: Leakage: none Urgency: No  Fluid intake: 64 oz water per day   INTERCOURSE:  Ability to have vaginal penetration No  Pain with intercourse: Initial Penetration, During Penetration, Deep Penetration, After Intercourse, and Pain Interrupts Intercourse Dryness: Yes  Climax: Has not had climax in over 5 years, does not perform self pleasure or stimulation currently  Lubricant: None   PREGNANCY: Vaginal deliveries 1 in 2003 Tearing No Episiotomy No C-section deliveries 1 Currently pregnant No  PROLAPSE: None   PRECAUTIONS: None  RED FLAGS: None   WEIGHT BEARING RESTRICTIONS: No  FALLS:  Has patient fallen in last 6 months? 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  DIAGNOSTIC FINDINGS:   NA   SENSATION: Light touch: Appears intact    GAIT: Assistive device utilized: None Comments: WFL  POSTURE: lower abdominal decreased tone, upper abdominal gripping. Minor forward shoulders, slightly decreased lumbar lordosis.    Breathing Assessment: breaths into shoulders and upper chest, early and over activation of cervical accessory muscles, paradoxical pattern of  breathing, decreased expansion into diaphragm. Breathing pattern consistent with increased gripping in diaphragm which could indicate PFM gripping as well.   ISA/Costal Mobility: limited, increased ISA bilaterally.  Abdominal: Decreaed lower abdominal tone, upper abdominal gripping due Abdominal Activation: Decreased proprioception of TrA activation.  Abdominal Gripping/Dumping: upper abdominal gripping lower abdominal dumping. Diastasis: No  Scar tissue: Yes: in suprapubic area from C-section in 2003 Active Straight Leg Raise: NT   LUMBARAROM/PROM:  A/PROM A/PROM  Eval (% available)  Flexion WFL   Extension WFL   Right lateral flexion WFL   Left lateral flexion WFL   Right rotation WFL   Left rotation WFL    (Blank rows = not tested)  PALPATION:  General: Minor increase in tension and tone in upper gluteals.                External Perineal Exam: NT during IE. To be performed at future visit if it is in patient's best interest and comfort                PELVIC Internal: held during IE. Discussed with patient and may be performed at a later date if needed and patient consents.     LOWER EXTREMITY ROM:  Passive ROM Right eval Left eval  Hip flexion    Hip extension    Hip abduction        Hip internal rotation Moderately limited Moderately limited   Hip external rotation    Knee flexion    Knee extension    Ankle dorsiflexion    Ankle plantarflexion    Ankle inversion    Ankle eversion     (Blank rows = not tested)  LOWER EXTREMITY MMT:  MMT Right eval Left eval  Hip flexion 4/5 4/5  Hip extension 4/5 4/5  Hip abduction 4/5 4/5  Hip adduction 4/5 4/5  Hip internal rotation 4/5 4/5  Hip external rotation /5 /5  Knee flexion /5 /5  Knee extension /5 /5  Ankle dorsiflexion /5 /5  Ankle plantarflexion /5 /5  Ankle inversion /5 /5  Ankle eversion /5 /5   (Blank rows = not tested)  TODAY'S TREATMENT:                                                                                                                               DATE: 05/06/2024   EVAL completed Self care: vaginal moisturizers, importance of neuromodulatory down training to impact pain experience, gentle touch both through clothing and to direct skin with relaxation breathing to vulva daily to initiate desensitization.   PATIENT EDUCATION:  Education details: vaginal moisturizers, importance of neuromodulatory down training to impact pain experience, gentle touch both through clothing and to direct skin with relaxation breathing to vulva daily to initiate desensitization.  Person educated: Patient Education method: Explanation and Handouts Education comprehension: verbalized understanding  HOME EXERCISE PROGRAM: Good clean love vaginal moisturizer 1x per day. Soft touch down training for vulva nightly with deep breathing   ASSESSMENT:  CLINICAL IMPRESSION: Pt is a 59 yo female who presents with vaginal pain with intercourse, peri-care and papsmears. She has had gradual onset over pain with intercourse as well as touch to her vulva or vagina over the past 14 years. She has trialed estrogen creams in the  past with minimal success but has not tried any treatment in over 5 years. Pt has not had climax in over 5 years and does not perform self stimulation or self touch. She has sharp and burning pain with external clitoral or vulvar stimulation and stabbing pain with deep penetration. She is unable to have penetrative intercourse or any other type of sexual contact with husband in several years. Pt has high levels of anxiety which affect her sympathetic nervous system and contribute to pain experience. Pt has paradoxical breathing patten indicating decreased descent of diaphragm and PFM relaxation. Pt is functionally limited in her ability to have sexual connection with her husband, self stimulation or touch, and has discomfort with peri-care and pain with pap-smears. Pt would benefit from a  biopsychosocial approach to yield optimal outcomes. Plan to build interdisciplinary team with pt's providers to optimize patient-centered care as needed.  Pt will benefit from skilled PT services in order to address the above impairments and promote improved quality of life and return to activity and participation.   Problem List:  Pain with external and internal vaginal stimulation She is unable to have intercourse and this causes distress in her relationship. 10/10 pain with penetration Pt has vaginal dryness and discomfort. She has not trialed vaginal moisturizers.  No current relaxation or down regulation exercises. She has open time in the morning and at night.    OBJECTIVE IMPAIRMENTS: decreased coordination, decreased endurance, postural dysfunction, and pain.   ACTIVITY LIMITATIONS: bathing, toileting, hygiene/grooming, and intercourse, papsmears   PARTICIPATION LIMITATIONS: interpersonal relationship  PERSONAL FACTORS: Behavior pattern, Past/current experiences, Social background, Time since onset of injury/illness/exacerbation, and 1 comorbidity: vaginal atrophy and dryness, post menopause are also affecting patient's functional outcome.   REHAB POTENTIAL: Good  CLINICAL DECISION MAKING: Evolving/moderate complexity  EVALUATION COMPLEXITY: Moderate   GOALS: Goals reviewed with patient? Yes  SHORT TERM GOALS: Target date: 06/03/2024     Pt will be consistent and independent with HEP to promote ability to perform daily activities with reduced symptoms. Baseline: Not IND  Goal status: INITIAL   LONG TERM GOALS: Target date: 07/29/2024   Pt will demonstrate improved breathing mechanics to improve diaphragmatic expansion and pelvic floor down training as well as promote neuromodulatory down regulation of sympathetic nervous system to promote decreased vulvar and vaginal pain.  Baseline: dyscoordination Goal status: INITIAL  2.  Pt will be able to self stimulate clitoris  and vulva without burning pain to promote improved tolerance to touch, papsmear and intercourse  Baseline: Pt is unable to perform self stimulation without pain and emotional discomfort Goal status: INITIAL  3. Pt will be able to perform gentle non-penetrative stimulation and connection with husband to work towards improved intimacy tolerance  Baseline: Unable to have any sexual engagement with husband Goal status: INITIAL  4. Pt will be able to have penetration of vagina with small dilator to work towards tolerance of papsmears and intercourse  Baseline: Pt unable ot tolerate any penetration without 10/10 pain  Goal status: INITIAL  5. Pt will report 50% improvement vaginal dryness to improve tissue health and tolerance to stimulation and touch  Baseline: Pt has vaginal dryness and discomfort. She has not trialed vaginal moisturizers.  Goal status: INITIAL  6. Pt will be consistent and independent with parasympathetic up training program to reduce overall stress levels and improve pain experience.   Baseline: No current relaxation or down regulation exercises. She has open time in the morning and at night.   Goal  status: INITIAL   PLAN:  PT FREQUENCY: 1x/week  PT DURATION: 12 weeks  PLANNED INTERVENTIONS: 97164- PT Re-evaluation, 97110-Therapeutic exercises, 97530- Therapeutic activity, 97112- Neuromuscular re-education, 97535- Self Care, [id number]- Manual therapy, [id number]- Electrical stimulation (unattended), [phone]- Electrical stimulation (manual), 20560 (1-2 muscles), 20561 (3+ muscles)- Dry Needling, Patient/Family education, Balance training, Taping, Joint mobilization, Scar mobilization, Cryotherapy, Moist heat, and Biofeedback   PLAN FOR NEXT SESSION: neuromodulatory down training, functional breathing, review of moisturizer and gentle touch down training   Donna Porter, PT 05/06/2024, 10:19 AM   "

## 2024-05-13 ENCOUNTER — Ambulatory Visit

## 2024-05-19 ENCOUNTER — Ambulatory Visit

## 2024-05-20 ENCOUNTER — Ambulatory Visit

## 2024-05-27 ENCOUNTER — Ambulatory Visit

## 2024-06-03 ENCOUNTER — Ambulatory Visit

## 2024-06-10 ENCOUNTER — Ambulatory Visit

## 2024-06-14 ENCOUNTER — Ambulatory Visit

## 2024-06-17 ENCOUNTER — Ambulatory Visit
